# Patient Record
Sex: Female | Born: 1970 | Hispanic: No | Marital: Married | Smoking: Never smoker
Health system: Southern US, Community
[De-identification: ages and names within clinical notes are randomized; demographics above are authoritative.]

## PROBLEM LIST (undated history)

## (undated) HISTORY — PX: AUGMENTATION MAMMAPLASTY: SUR837

---

## 2014-01-08 ENCOUNTER — Other Ambulatory Visit (HOSPITAL_COMMUNITY)
Admission: RE | Admit: 2014-01-08 | Discharge: 2014-01-08 | Disposition: A | Discharge status: Home / Self Care | Payer: BC Managed Care – PPO | Source: Ambulatory Visit | Attending: Obstetrics & Gynecology | Admitting: Obstetrics & Gynecology

## 2014-01-08 DIAGNOSIS — Z1151 Encounter for screening for human papillomavirus (HPV): Secondary | ICD-10-CM | POA: Insufficient documentation

## 2014-01-08 DIAGNOSIS — Z01419 Encounter for gynecological examination (general) (routine) without abnormal findings: Secondary | ICD-10-CM | POA: Insufficient documentation

## 2015-09-04 MED FILL — VYVANSE 40 MG CAPSULE: 40 | 30 days supply | Qty: 30 | Fill #0

## 2015-10-02 DIAGNOSIS — E039 Hypothyroidism, unspecified: Secondary | ICD-10-CM | POA: Diagnosis not present

## 2015-10-02 DIAGNOSIS — Z79899 Other long term (current) drug therapy: Secondary | ICD-10-CM | POA: Diagnosis not present

## 2015-10-02 DIAGNOSIS — F902 Attention-deficit hyperactivity disorder, combined type: Secondary | ICD-10-CM | POA: Diagnosis not present

## 2015-10-09 MED FILL — VYVANSE 40 MG CAPSULE: 40 | 30 days supply | Qty: 30 | Fill #0

## 2015-11-06 DIAGNOSIS — N951 Menopausal and female climacteric states: Secondary | ICD-10-CM | POA: Diagnosis not present

## 2015-11-06 DIAGNOSIS — Z01411 Encounter for gynecological examination (general) (routine) with abnormal findings: Secondary | ICD-10-CM | POA: Diagnosis not present

## 2015-11-06 DIAGNOSIS — R140 Abdominal distension (gaseous): Secondary | ICD-10-CM | POA: Diagnosis not present

## 2015-11-06 DIAGNOSIS — R102 Pelvic and perineal pain: Secondary | ICD-10-CM | POA: Diagnosis not present

## 2015-11-06 MED FILL — ESTRADIOL PATCH 0.0375: 0.0375 | 28 days supply | Qty: 8 | Fill #0

## 2015-11-06 MED FILL — LEVOTHYROXINE 50 MCG TABLET: 50 | 90 days supply | Qty: 90 | Fill #0

## 2015-11-08 MED FILL — VYVANSE 40 MG CAPSULE: 40 | 30 days supply | Qty: 30 | Fill #0

## 2015-12-10 MED FILL — VYVANSE 40 MG CAPSULE: 40 | 30 days supply | Qty: 30 | Fill #0

## 2015-12-10 MED FILL — ESTRADIOL PATCH 0.0375: 0.0375 | 28 days supply | Qty: 8 | Fill #1

## 2015-12-18 ENCOUNTER — Other Ambulatory Visit: Payer: Self-pay | Admitting: Obstetrics & Gynecology

## 2015-12-18 DIAGNOSIS — Z1231 Encounter for screening mammogram for malignant neoplasm of breast: Secondary | ICD-10-CM

## 2016-01-01 DIAGNOSIS — R938 Abnormal findings on diagnostic imaging of other specified body structures: Secondary | ICD-10-CM | POA: Diagnosis not present

## 2016-01-01 DIAGNOSIS — R102 Pelvic and perineal pain: Secondary | ICD-10-CM | POA: Diagnosis not present

## 2016-01-01 DIAGNOSIS — N951 Menopausal and female climacteric states: Secondary | ICD-10-CM | POA: Diagnosis not present

## 2016-01-01 DIAGNOSIS — R140 Abdominal distension (gaseous): Secondary | ICD-10-CM | POA: Diagnosis not present

## 2016-01-01 MED FILL — ESTRADIOL PATCH 0.0375: 0.0375 | 84 days supply | Qty: 24 | Fill #0

## 2016-01-08 ENCOUNTER — Ambulatory Visit
Admission: RE | Admit: 2016-01-08 | Discharge: 2016-01-08 | Disposition: A | Discharge status: Home / Self Care | Payer: 59 | Source: Ambulatory Visit | Attending: Obstetrics & Gynecology | Admitting: Obstetrics & Gynecology

## 2016-01-08 DIAGNOSIS — Z1231 Encounter for screening mammogram for malignant neoplasm of breast: Secondary | ICD-10-CM | POA: Diagnosis not present

## 2016-01-09 MED FILL — VYVANSE 40 MG CAPSULE: 40 | 30 days supply | Qty: 30 | Fill #0

## 2016-02-13 MED FILL — VYVANSE 40 MG CAPSULE: 40 | 30 days supply | Qty: 30 | Fill #0

## 2016-02-21 MED FILL — LEVOTHYROXINE 50 MCG TABLET: 50 | 90 days supply | Qty: 90 | Fill #1

## 2016-03-20 MED FILL — VYVANSE 40 MG CAPSULE: 40 | 30 days supply | Qty: 30 | Fill #0

## 2016-03-27 MED FILL — ESTRADIOL PATCH 0.0375: 0.0375 | 84 days supply | Qty: 24 | Fill #1

## 2016-04-22 DIAGNOSIS — E039 Hypothyroidism, unspecified: Secondary | ICD-10-CM | POA: Diagnosis not present

## 2016-04-22 DIAGNOSIS — F902 Attention-deficit hyperactivity disorder, combined type: Secondary | ICD-10-CM | POA: Diagnosis not present

## 2016-04-22 DIAGNOSIS — Z79899 Other long term (current) drug therapy: Secondary | ICD-10-CM | POA: Diagnosis not present

## 2016-04-30 MED FILL — VYVANSE 40 MG CAPSULE: 40 | 30 days supply | Qty: 30 | Fill #0

## 2016-06-08 MED FILL — VYVANSE 40 MG CAPSULE: 40 | 30 days supply | Qty: 30 | Fill #0

## 2016-07-10 MED FILL — VYVANSE 40 MG CAPSULE: 40 | 30 days supply | Qty: 30 | Fill #0

## 2016-07-14 MED FILL — ESTRADIOL PATCH 0.0375: 0.0375 | 84 days supply | Qty: 24 | Fill #2

## 2016-07-22 DIAGNOSIS — R938 Abnormal findings on diagnostic imaging of other specified body structures: Secondary | ICD-10-CM | POA: Diagnosis not present

## 2016-07-22 DIAGNOSIS — N840 Polyp of corpus uteri: Secondary | ICD-10-CM | POA: Diagnosis not present

## 2016-07-22 DIAGNOSIS — N950 Postmenopausal bleeding: Secondary | ICD-10-CM | POA: Diagnosis not present

## 2016-07-22 DIAGNOSIS — E039 Hypothyroidism, unspecified: Secondary | ICD-10-CM | POA: Diagnosis not present

## 2016-07-29 DIAGNOSIS — E039 Hypothyroidism, unspecified: Secondary | ICD-10-CM | POA: Diagnosis not present

## 2016-07-29 DIAGNOSIS — L309 Dermatitis, unspecified: Secondary | ICD-10-CM | POA: Diagnosis not present

## 2016-07-29 DIAGNOSIS — E7800 Pure hypercholesterolemia, unspecified: Secondary | ICD-10-CM | POA: Diagnosis not present

## 2016-07-29 MED FILL — CLOBETASOL 0.05% CREAM: 0.05 | 15 days supply | Qty: 30 | Fill #0

## 2016-07-29 MED FILL — LEVOTHYROXINE 50 MCG TABLET: 50 | 30 days supply | Qty: 30 | Fill #0

## 2016-07-29 MED FILL — MUPIROCIN 2% OINTMENT: 2 | 10 days supply | Qty: 22 | Fill #0

## 2016-08-10 ENCOUNTER — Ambulatory Visit (INDEPENDENT_AMBULATORY_CARE_PROVIDER_SITE_OTHER): Payer: 59 | Admitting: Family Medicine

## 2016-08-10 ENCOUNTER — Encounter: Payer: Self-pay | Admitting: Family Medicine

## 2016-08-10 VITALS — BP 136/88 | HR 90 | Resp 16

## 2016-08-10 DIAGNOSIS — R0789 Other chest pain: Secondary | ICD-10-CM

## 2016-08-10 NOTE — Progress Notes (Signed)
   Subjective:    Patient ID: Robin Walter, female    DOB: November 10, 1970, 46 y.o.   MRN: [id number]  HPI  She is seen here today with chest wall pain that started 2 days ago. Pain is described as occurring with movement and taking deep breaths. This pain is noted to be sharp and worse this morning; mild improvement with 400 mg ibuprofen today.  Today, she denies SOB, chest pain, arm pain, N/V, sweating, tearing pain, cough, leg swelling, or reflux.  History of estrogen replacement with patch. No history of DVT, immobilizations, hemoptysis, malignancy, DOE, dysphagia, fever,  heartburn, or regurgitation. She reports feeling pain with movement of shoulder and can recreate pain with movement. No pain with palpation.  She is seeking care today as she is having a surgical procedure tomorrow and would like to have lungs auscultated.   Review of Systems  Constitutional: Negative for chills, fatigue and fever.  HENT: Negative for congestion.   Respiratory: Negative for cough, shortness of breath and wheezing.   Cardiovascular: Negative for chest pain and palpitations.  Gastrointestinal: Negative for abdominal pain, diarrhea, nausea and vomiting.  Genitourinary: Negative for dysuria.  Musculoskeletal:       Chest wall pain  Skin: Negative for rash.  Neurological: Negative for dizziness, light-headedness and headaches.   No past medical history on file.   Social History   Social History  . Marital status: Married    Spouse name: N/A  . Number of children: N/A  . Years of education: N/A   Occupational History  . Not on file.   Social History Main Topics  . Smoking status: Not on file  . Smokeless tobacco: Not on file  . Alcohol use Not on file  . Drug use: Unknown  . Sexual activity: Not on file   Other Topics Concern  . Not on file   Social History Narrative  . No narrative on file    No past surgical history on file.  No family history on file.  Allergies not on file  No  current outpatient prescriptions on file prior to visit.   No current facility-administered medications on file prior to visit.     BP 136/88   Pulse 90   Resp 16   LMP  (LMP Unknown)   SpO2 99%       Objective:   Physical Exam  Constitutional: She is oriented to person, place, and time. She appears well-developed and well-nourished.  Eyes: Pupils are equal, round, and reactive to light. No scleral icterus.  Neck: Neck supple.  Cardiovascular: Normal rate, regular rhythm and normal heart sounds.   Pulmonary/Chest: Effort normal and breath sounds normal. She has no wheezes. She has no rales.  Abdominal: There is no tenderness.  Lymphadenopathy:    She has no cervical adenopathy.  Neurological: She is alert and oriented to person, place, and time.  Skin: Skin is warm and dry. No rash noted.       Assessment & Plan:  1. Musculoskeletal chest pain Exam and history are reassuring; excellent movement of air; pulse oximeter 99%; no SOB; pain is reproducible with movement. EKG and further work up declined by patient as symptoms are explainable with movement and improvement with ibuprofen.  Patient is a physician and aware of return precautions. She voiced understanding to seek medical attention immediately with worsening pain, radiation, SOB, or new symptoms.  Delano Metz, FNP-C

## 2016-08-11 DIAGNOSIS — N840 Polyp of corpus uteri: Secondary | ICD-10-CM | POA: Diagnosis not present

## 2016-08-11 DIAGNOSIS — N950 Postmenopausal bleeding: Secondary | ICD-10-CM | POA: Diagnosis not present

## 2016-08-13 MED FILL — VYVANSE 40 MG CAPSULE: 40 | 30 days supply | Qty: 30 | Fill #0

## 2016-09-02 DIAGNOSIS — Z4889 Encounter for other specified surgical aftercare: Secondary | ICD-10-CM | POA: Diagnosis not present

## 2016-09-02 DIAGNOSIS — N951 Menopausal and female climacteric states: Secondary | ICD-10-CM | POA: Diagnosis not present

## 2016-09-09 MED FILL — LEVOTHYROXINE 50 MCG TABLET: 50 | 90 days supply | Qty: 90 | Fill #0

## 2016-09-14 MED FILL — VYVANSE 40 MG CAPSULE: 40 | 30 days supply | Qty: 30 | Fill #0

## 2016-09-25 DIAGNOSIS — Z79899 Other long term (current) drug therapy: Secondary | ICD-10-CM | POA: Diagnosis not present

## 2016-09-25 DIAGNOSIS — F902 Attention-deficit hyperactivity disorder, combined type: Secondary | ICD-10-CM | POA: Diagnosis not present

## 2016-09-25 DIAGNOSIS — E039 Hypothyroidism, unspecified: Secondary | ICD-10-CM | POA: Diagnosis not present

## 2016-10-14 MED FILL — ESTRADIOL PATCH 0.0375: 0.0375 | 84 days supply | Qty: 24 | Fill #3

## 2016-10-16 MED FILL — VYVANSE 40 MG CAPSULE: 40 | 30 days supply | Qty: 30 | Fill #0

## 2016-11-11 MED FILL — VYVANSE 40 MG CAPSULE: 40 | 30 days supply | Qty: 30 | Fill #0

## 2016-12-10 MED FILL — VYVANSE 40 MG CAPSULE: 40 | 30 days supply | Qty: 30 | Fill #0

## 2016-12-28 MED FILL — LEVOTHYROXINE 50 MCG TABLET: 50 | 90 days supply | Qty: 90 | Fill #1

## 2016-12-28 MED FILL — CLOBETASOL 0.05% CREAM: 0.05 | 15 days supply | Qty: 30 | Fill #1

## 2016-12-28 MED FILL — ESTRADIOL PATCH 0.0375: 0.0375 | 84 days supply | Qty: 24 | Fill #0

## 2016-12-31 DIAGNOSIS — Z79899 Other long term (current) drug therapy: Secondary | ICD-10-CM | POA: Diagnosis not present

## 2016-12-31 DIAGNOSIS — F902 Attention-deficit hyperactivity disorder, combined type: Secondary | ICD-10-CM | POA: Diagnosis not present

## 2016-12-31 DIAGNOSIS — F1920 Other psychoactive substance dependence, uncomplicated: Secondary | ICD-10-CM | POA: Diagnosis not present

## 2017-01-08 DIAGNOSIS — Z111 Encounter for screening for respiratory tuberculosis: Secondary | ICD-10-CM | POA: Diagnosis not present

## 2017-01-14 MED FILL — VYVANSE 40 MG CAPSULE: 40 | 30 days supply | Qty: 30 | Fill #0

## 2017-03-11 DIAGNOSIS — H524 Presbyopia: Secondary | ICD-10-CM | POA: Diagnosis not present

## 2017-03-11 DIAGNOSIS — H1013 Acute atopic conjunctivitis, bilateral: Secondary | ICD-10-CM | POA: Diagnosis not present

## 2017-03-11 DIAGNOSIS — H11441 Conjunctival cysts, right eye: Secondary | ICD-10-CM | POA: Diagnosis not present

## 2017-03-11 MED FILL — VYVANSE 40 MG CAPSULE: 40 | 30 days supply | Qty: 30 | Fill #0

## 2017-04-15 DIAGNOSIS — F902 Attention-deficit hyperactivity disorder, combined type: Secondary | ICD-10-CM | POA: Diagnosis not present

## 2017-04-15 DIAGNOSIS — Z79899 Other long term (current) drug therapy: Secondary | ICD-10-CM | POA: Diagnosis not present

## 2017-04-15 MED FILL — VYVANSE 40 MG CAPSULE: 40 | 30 days supply | Qty: 30 | Fill #0

## 2017-04-15 MED FILL — LEVOTHYROXINE 50 MCG TABLET: 50 | 90 days supply | Qty: 90 | Fill #2

## 2017-04-30 MED FILL — ESTRADIOL PATCH 0.0375: 0.0375 | 84 days supply | Qty: 24 | Fill #0

## 2017-05-06 MED FILL — CLOBETASOL PROPIONATE 0.05: 0.05 | 15 days supply | Qty: 30 | Fill #2

## 2017-07-09 MED FILL — LEVOTHYROXINE 50 MCG TABLET: 50 | 90 days supply | Qty: 90 | Fill #3

## 2017-08-12 DIAGNOSIS — F430 Acute stress reaction: Secondary | ICD-10-CM | POA: Diagnosis not present

## 2017-08-12 DIAGNOSIS — F329 Major depressive disorder, single episode, unspecified: Secondary | ICD-10-CM | POA: Diagnosis not present

## 2017-08-12 DIAGNOSIS — L309 Dermatitis, unspecified: Secondary | ICD-10-CM | POA: Diagnosis not present

## 2017-08-12 MED FILL — buPROPion HCL ER (XL) 150 M: 150 | 30 days supply | Qty: 30 | Fill #0

## 2017-09-08 MED FILL — buPROPion HCL ER (XL) 150 M: 150 | 30 days supply | Qty: 30 | Fill #1

## 2017-09-16 DIAGNOSIS — F329 Major depressive disorder, single episode, unspecified: Secondary | ICD-10-CM | POA: Diagnosis not present

## 2017-09-16 DIAGNOSIS — E785 Hyperlipidemia, unspecified: Secondary | ICD-10-CM | POA: Diagnosis not present

## 2017-09-16 MED FILL — ROSUVASTATIN CALCIUM 10 MG: 10 | 30 days supply | Qty: 30 | Fill #0

## 2017-09-16 MED FILL — BUPROPION HCL XL 300 MG TAB: 300 | 30 days supply | Qty: 30 | Fill #0

## 2017-10-13 DIAGNOSIS — E785 Hyperlipidemia, unspecified: Secondary | ICD-10-CM | POA: Diagnosis not present

## 2017-10-25 MED FILL — BUPROPION HCL XL 300 MG TAB: 300 | 30 days supply | Qty: 30 | Fill #1

## 2017-10-25 MED FILL — LEVOTHYROXINE 50 MCG TABLET: 50 | 90 days supply | Qty: 90 | Fill #0

## 2017-11-24 MED FILL — BUPROPION HCL XL 300 MG TAB: 300 | 30 days supply | Qty: 30 | Fill #2

## 2017-12-16 DIAGNOSIS — E785 Hyperlipidemia, unspecified: Secondary | ICD-10-CM | POA: Diagnosis not present

## 2017-12-16 DIAGNOSIS — L309 Dermatitis, unspecified: Secondary | ICD-10-CM | POA: Diagnosis not present

## 2017-12-16 DIAGNOSIS — F329 Major depressive disorder, single episode, unspecified: Secondary | ICD-10-CM | POA: Diagnosis not present

## 2017-12-16 MED FILL — CLOBETASOL PROPIONATE 0.05: 0.05 | 30 days supply | Qty: 15 | Fill #0

## 2017-12-16 MED FILL — ROSUVASTATIN CALCIUM 10 MG: 10 | 30 days supply | Qty: 30 | Fill #0

## 2017-12-22 MED FILL — buPROPion HCL ER (XL) 300 M: 300 | 30 days supply | Qty: 30 | Fill #0

## 2018-01-24 MED FILL — buPROPion HCL ER (XL) 300 M: 300 | 30 days supply | Qty: 30 | Fill #1

## 2018-03-02 MED FILL — buPROPion HCL ER (XL) 300 M: 300 | 30 days supply | Qty: 30 | Fill #2

## 2018-04-05 MED FILL — LEVOTHYROXINE 50 MCG TABLET: 50 | 90 days supply | Qty: 90 | Fill #1

## 2018-04-13 MED FILL — buPROPion HCL ER (XL) 300 M: 300 | 30 days supply | Qty: 30 | Fill #3

## 2018-06-03 MED FILL — buPROPion HCL ER (XL) 300 M: 300 | 30 days supply | Qty: 30 | Fill #4

## 2018-06-16 MED FILL — CLOBETASOL PROPIONATE 0.05: 0.05 | 30 days supply | Qty: 15 | Fill #1

## 2018-07-07 DIAGNOSIS — E559 Vitamin D deficiency, unspecified: Secondary | ICD-10-CM | POA: Diagnosis not present

## 2018-07-07 DIAGNOSIS — F329 Major depressive disorder, single episode, unspecified: Secondary | ICD-10-CM | POA: Diagnosis not present

## 2018-07-07 DIAGNOSIS — Z0000 Encounter for general adult medical examination without abnormal findings: Secondary | ICD-10-CM | POA: Diagnosis not present

## 2018-07-07 DIAGNOSIS — E785 Hyperlipidemia, unspecified: Secondary | ICD-10-CM | POA: Diagnosis not present

## 2018-07-07 DIAGNOSIS — L309 Dermatitis, unspecified: Secondary | ICD-10-CM | POA: Diagnosis not present

## 2018-07-07 MED FILL — ROSUVASTATIN CALCIUM 10 MG: 10 | 89 days supply | Qty: 38 | Fill #0

## 2018-07-07 MED FILL — buPROPion HCL ER (XL) 300 M: 300 | 90 days supply | Qty: 90 | Fill #0

## 2018-07-07 MED FILL — MUPIROCIN 2% OINTMENT: 2 | 14 days supply | Qty: 22 | Fill #0

## 2018-08-05 ENCOUNTER — Other Ambulatory Visit: Payer: Self-pay | Admitting: Family Medicine

## 2018-08-05 MED ORDER — OSELTAMIVIR PHOSPHATE 75 MG PO CAPS: 75.0000 mg | ORAL_CAPSULE | Freq: Two times a day (BID) | ORAL | 0 refills | Status: AC

## 2018-09-15 MED FILL — LEVOTHYROXINE 50 MCG TABLET: 50 | 90 days supply | Qty: 90 | Fill #0

## 2018-10-17 MED FILL — buPROPion HCL ER (XL) 300 M: 300 | 90 days supply | Qty: 90 | Fill #1

## 2019-01-05 DIAGNOSIS — E785 Hyperlipidemia, unspecified: Secondary | ICD-10-CM | POA: Diagnosis not present

## 2019-01-05 DIAGNOSIS — E039 Hypothyroidism, unspecified: Secondary | ICD-10-CM | POA: Diagnosis not present

## 2019-01-05 DIAGNOSIS — F329 Major depressive disorder, single episode, unspecified: Secondary | ICD-10-CM | POA: Diagnosis not present

## 2019-01-05 MED FILL — ROSUVASTATIN CALCIUM 10 MG: 10 | 90 days supply | Qty: 90 | Fill #0

## 2019-01-05 MED FILL — buPROPion HCL ER (XL) 300 M: 300 | 90 days supply | Qty: 90 | Fill #0

## 2019-01-05 MED FILL — LEVOTHYROXINE 50 MCG TABLET: 50 | 90 days supply | Qty: 90 | Fill #0

## 2019-03-03 ENCOUNTER — Other Ambulatory Visit: Payer: Self-pay | Admitting: Occupational Medicine

## 2019-03-04 LAB — CBC WITH DIFFERENTIAL/PLATELET
Absolute Monocytes: 589 cells/uL (ref 200–950)
Basophils Absolute: 58 cells/uL (ref 0–200)
Basophils Relative: 0.9 %
Eosinophils Absolute: 237 cells/uL (ref 15–500)
Eosinophils Relative: 3.7 %
HCT: 43.2 % (ref 35.0–45.0)
Hemoglobin: 14.4 g/dL (ref 11.7–15.5)
Lymphs Abs: 1043 cells/uL (ref 850–3900)
MCH: 29.8 pg (ref 27.0–33.0)
MCHC: 33.3 g/dL (ref 32.0–36.0)
MCV: 89.3 fL (ref 80.0–100.0)
MPV: 10.3 fL (ref 7.5–12.5)
Monocytes Relative: 9.2 %
Neutro Abs: 4474 cells/uL (ref 1500–7800)
Neutrophils Relative %: 69.9 %
Platelets: 351 10*3/uL (ref 140–400)
RBC: 4.84 10*6/uL (ref 3.80–5.10)
RDW: 13.3 % (ref 11.0–15.0)
Total Lymphocyte: 16.3 %
WBC: 6.4 10*3/uL (ref 3.8–10.8)

## 2019-03-04 LAB — COMPLETE METABOLIC PANEL WITH GFR
AG Ratio: 1.8 (calc) (ref 1.0–2.5)
ALT: 22 U/L (ref 6–29)
AST: 21 U/L (ref 10–35)
Albumin: 4.6 g/dL (ref 3.6–5.1)
Alkaline phosphatase (APISO): 107 U/L (ref 31–125)
BUN: 15 mg/dL (ref 7–25)
CO2: 22 mmol/L (ref 20–32)
Calcium: 9.5 mg/dL (ref 8.6–10.2)
Chloride: 104 mmol/L (ref 98–110)
Creat: 0.9 mg/dL (ref 0.50–1.10)
GFR, Est African American: 87 mL/min/{1.73_m2} (ref >=60)
GFR, Est Non African American: 75 mL/min/{1.73_m2} (ref >=60)
Globulin: 2.6 g/dL (calc) (ref 1.9–3.7)
Glucose, Bld: 101 mg/dL — ABNORMAL HIGH (ref 65–99)
Potassium: 4.4 mmol/L (ref 3.5–5.3)
Sodium: 139 mmol/L (ref 135–146)
Total Bilirubin: 0.6 mg/dL (ref 0.2–1.2)
Total Protein: 7.2 g/dL (ref 6.1–8.1)

## 2019-03-04 LAB — LIPID PANEL
Cholesterol: 170 mg/dL (ref <200)
HDL: 48 mg/dL — ABNORMAL LOW (ref >=50)
LDL Cholesterol (Calc): 101 mg/dL (calc) — ABNORMAL HIGH
Non-HDL Cholesterol (Calc): 122 mg/dL (calc) (ref <130)
Total CHOL/HDL Ratio: 3.5 (calc) (ref <5.0)
Triglycerides: 111 mg/dL (ref <150)

## 2019-03-04 LAB — VITAMIN D 25 HYDROXY (VIT D DEFICIENCY, FRACTURES): Vit D, 25-Hydroxy: 24 ng/mL — ABNORMAL LOW (ref 30–100)

## 2019-03-04 LAB — TSH: TSH: 2.61 mIU/L

## 2019-03-04 LAB — HIGH SENSITIVITY CRP: hs-CRP: 1.5 mg/L

## 2019-03-04 LAB — VITAMIN B12: Vitamin B-12: 512 pg/mL (ref 200–1100)

## 2019-05-01 DIAGNOSIS — H1045 Other chronic allergic conjunctivitis: Secondary | ICD-10-CM | POA: Diagnosis not present

## 2019-05-01 DIAGNOSIS — H5319 Other subjective visual disturbances: Secondary | ICD-10-CM | POA: Diagnosis not present

## 2019-05-01 DIAGNOSIS — H5711 Ocular pain, right eye: Secondary | ICD-10-CM | POA: Diagnosis not present

## 2019-05-01 DIAGNOSIS — H11441 Conjunctival cysts, right eye: Secondary | ICD-10-CM | POA: Diagnosis not present

## 2019-05-11 MED FILL — BUPROPION HCL ER (XL) 300 M: 300 | 90 days supply | Qty: 90 | Fill #1

## 2019-06-08 DIAGNOSIS — D225 Melanocytic nevi of trunk: Secondary | ICD-10-CM | POA: Diagnosis not present

## 2019-06-08 DIAGNOSIS — D224 Melanocytic nevi of scalp and neck: Secondary | ICD-10-CM | POA: Diagnosis not present

## 2019-07-27 DIAGNOSIS — Z1211 Encounter for screening for malignant neoplasm of colon: Secondary | ICD-10-CM | POA: Diagnosis not present

## 2019-07-27 DIAGNOSIS — E785 Hyperlipidemia, unspecified: Secondary | ICD-10-CM | POA: Diagnosis not present

## 2019-07-27 DIAGNOSIS — F329 Major depressive disorder, single episode, unspecified: Secondary | ICD-10-CM | POA: Diagnosis not present

## 2019-07-27 DIAGNOSIS — E559 Vitamin D deficiency, unspecified: Secondary | ICD-10-CM | POA: Diagnosis not present

## 2019-07-27 DIAGNOSIS — Z0000 Encounter for general adult medical examination without abnormal findings: Secondary | ICD-10-CM | POA: Diagnosis not present

## 2019-07-31 ENCOUNTER — Other Ambulatory Visit: Payer: Self-pay | Admitting: Obstetrics & Gynecology

## 2019-08-02 MED FILL — LEVOTHYROXINE 50 MCG TABLET: 50 | 90 days supply | Qty: 90 | Fill #0

## 2019-08-03 DIAGNOSIS — N644 Mastodynia: Secondary | ICD-10-CM | POA: Diagnosis not present

## 2019-08-07 ENCOUNTER — Other Ambulatory Visit: Payer: Self-pay | Admitting: Obstetrics & Gynecology

## 2019-08-07 DIAGNOSIS — N644 Mastodynia: Secondary | ICD-10-CM

## 2019-08-31 ENCOUNTER — Other Ambulatory Visit: Payer: Self-pay | Admitting: Obstetrics & Gynecology

## 2019-08-31 ENCOUNTER — Other Ambulatory Visit: Payer: Self-pay

## 2019-08-31 ENCOUNTER — Ambulatory Visit
Admission: RE | Admit: 2019-08-31 | Discharge: 2019-08-31 | Disposition: A | Discharge status: Home / Self Care | Payer: 59 | Source: Ambulatory Visit | Attending: Obstetrics & Gynecology | Admitting: Obstetrics & Gynecology

## 2019-08-31 DIAGNOSIS — N644 Mastodynia: Secondary | ICD-10-CM

## 2019-08-31 DIAGNOSIS — N6311 Unspecified lump in the right breast, upper outer quadrant: Secondary | ICD-10-CM | POA: Diagnosis not present

## 2019-08-31 DIAGNOSIS — R928 Other abnormal and inconclusive findings on diagnostic imaging of breast: Secondary | ICD-10-CM | POA: Diagnosis not present

## 2019-08-31 DIAGNOSIS — N6012 Diffuse cystic mastopathy of left breast: Secondary | ICD-10-CM | POA: Diagnosis not present

## 2019-09-07 DIAGNOSIS — Z01419 Encounter for gynecological examination (general) (routine) without abnormal findings: Secondary | ICD-10-CM | POA: Diagnosis not present

## 2019-09-20 MED FILL — SCOPOLAMINE 1 MG/3DAYS PT72: 1 | 9 days supply | Qty: 3 | Fill #0

## 2019-10-10 MED FILL — LEVOTHYROXINE SODIUM 50 MCG: 50 | 90 days supply | Qty: 90 | Fill #0

## 2019-11-28 ENCOUNTER — Other Ambulatory Visit (HOSPITAL_BASED_OUTPATIENT_CLINIC_OR_DEPARTMENT_OTHER): Payer: Self-pay | Admitting: Family Medicine

## 2019-11-28 MED FILL — buPROPion HCL ER (XL) 300 M: 300 | 90 days supply | Qty: 90 | Fill #0

## 2020-01-11 ENCOUNTER — Other Ambulatory Visit (HOSPITAL_BASED_OUTPATIENT_CLINIC_OR_DEPARTMENT_OTHER): Payer: Self-pay | Admitting: Family Medicine

## 2020-01-11 DIAGNOSIS — N390 Urinary tract infection, site not specified: Secondary | ICD-10-CM | POA: Diagnosis not present

## 2020-01-11 DIAGNOSIS — E039 Hypothyroidism, unspecified: Secondary | ICD-10-CM | POA: Diagnosis not present

## 2020-01-11 DIAGNOSIS — E785 Hyperlipidemia, unspecified: Secondary | ICD-10-CM | POA: Diagnosis not present

## 2020-01-11 DIAGNOSIS — R300 Dysuria: Secondary | ICD-10-CM | POA: Diagnosis not present

## 2020-01-11 MED FILL — SYNTHROID 50 MCG TABLET: 50 | 90 days supply | Qty: 90 | Fill #0

## 2020-01-11 MED FILL — SULFAMETHOXAZOLE-TMP DS TAB: 800-160 | 7 days supply | Qty: 14 | Fill #0

## 2020-03-07 DIAGNOSIS — E039 Hypothyroidism, unspecified: Secondary | ICD-10-CM | POA: Diagnosis not present

## 2020-03-07 DIAGNOSIS — E785 Hyperlipidemia, unspecified: Secondary | ICD-10-CM | POA: Diagnosis not present

## 2020-03-07 DIAGNOSIS — Z01818 Encounter for other preprocedural examination: Secondary | ICD-10-CM | POA: Diagnosis not present

## 2020-03-07 DIAGNOSIS — R7309 Other abnormal glucose: Secondary | ICD-10-CM | POA: Diagnosis not present

## 2020-03-07 MED FILL — buPROPion HCL ER (XL) 300 M: 300 | 90 days supply | Qty: 90 | Fill #1

## 2020-04-16 MED FILL — SYNTHROID 50 MCG TABLET: 50 | 90 days supply | Qty: 90 | Fill #1

## 2020-04-25 DIAGNOSIS — R1012 Left upper quadrant pain: Secondary | ICD-10-CM | POA: Diagnosis not present

## 2020-04-25 DIAGNOSIS — R1902 Left upper quadrant abdominal swelling, mass and lump: Secondary | ICD-10-CM | POA: Diagnosis not present

## 2020-07-08 ENCOUNTER — Other Ambulatory Visit (HOSPITAL_BASED_OUTPATIENT_CLINIC_OR_DEPARTMENT_OTHER): Payer: Self-pay | Admitting: Family Medicine

## 2020-07-08 MED FILL — SYNTHROID 50 MCG TABLET: 50 | 90 days supply | Qty: 90 | Fill #0

## 2020-07-20 ENCOUNTER — Other Ambulatory Visit (HOSPITAL_BASED_OUTPATIENT_CLINIC_OR_DEPARTMENT_OTHER): Payer: Self-pay | Admitting: Plastic Surgery

## 2020-07-22 MED FILL — CEPHALEXIN 500 MG CAPSULE: 500 | 3 days supply | Qty: 12 | Fill #0

## 2020-07-22 MED FILL — ONDANSETRON ODT 4 MG TABLET: 4 | 2 days supply | Qty: 10 | Fill #0

## 2020-07-22 MED FILL — oxyCODONE HCL 5 MG TABS: 5 | 4 days supply | Qty: 25 | Fill #0

## 2020-07-22 MED FILL — diazePAM 2 MG TABS: 2 | 5 days supply | Qty: 15 | Fill #0

## 2020-07-24 DIAGNOSIS — E785 Hyperlipidemia, unspecified: Secondary | ICD-10-CM | POA: Diagnosis not present

## 2020-07-24 DIAGNOSIS — E039 Hypothyroidism, unspecified: Secondary | ICD-10-CM | POA: Diagnosis not present

## 2020-07-24 DIAGNOSIS — Z01818 Encounter for other preprocedural examination: Secondary | ICD-10-CM | POA: Diagnosis not present

## 2020-07-30 DIAGNOSIS — D171 Benign lipomatous neoplasm of skin and subcutaneous tissue of trunk: Secondary | ICD-10-CM | POA: Diagnosis not present

## 2020-07-30 DIAGNOSIS — T8544XA Capsular contracture of breast implant, initial encounter: Secondary | ICD-10-CM | POA: Diagnosis not present

## 2020-07-30 DIAGNOSIS — T8549XD Other mechanical complication of breast prosthesis and implant, subsequent encounter: Secondary | ICD-10-CM | POA: Diagnosis not present

## 2020-09-19 ENCOUNTER — Other Ambulatory Visit (HOSPITAL_BASED_OUTPATIENT_CLINIC_OR_DEPARTMENT_OTHER): Payer: Self-pay

## 2020-09-19 DIAGNOSIS — E039 Hypothyroidism, unspecified: Secondary | ICD-10-CM | POA: Diagnosis not present

## 2020-09-19 DIAGNOSIS — E785 Hyperlipidemia, unspecified: Secondary | ICD-10-CM | POA: Diagnosis not present

## 2020-09-19 DIAGNOSIS — Z0000 Encounter for general adult medical examination without abnormal findings: Secondary | ICD-10-CM | POA: Diagnosis not present

## 2020-09-19 DIAGNOSIS — Z1211 Encounter for screening for malignant neoplasm of colon: Secondary | ICD-10-CM | POA: Diagnosis not present

## 2020-09-19 DIAGNOSIS — Z7184 Encounter for health counseling related to travel: Secondary | ICD-10-CM | POA: Diagnosis not present

## 2020-09-19 MED ORDER — SCOPOLAMINE 1 MG/3DAYS TD PT72
MEDICATED_PATCH | TRANSDERMAL | 0 refills | Status: AC
  Filled 2020-09-19: qty 3, 9d supply, fill #0

## 2020-09-19 MED ORDER — LEVOTHYROXINE SODIUM 50 MCG PO TABS
ORAL_TABLET | ORAL | 3 refills | Status: AC
  Filled 2020-09-19: qty 90, 90d supply, fill #0
  Filled 2021-03-20: qty 90, 90d supply, fill #1

## 2020-12-02 DIAGNOSIS — Z1211 Encounter for screening for malignant neoplasm of colon: Secondary | ICD-10-CM | POA: Diagnosis not present

## 2020-12-06 ENCOUNTER — Other Ambulatory Visit (HOSPITAL_BASED_OUTPATIENT_CLINIC_OR_DEPARTMENT_OTHER): Payer: Self-pay

## 2020-12-06 MED ORDER — CLOBETASOL PROPIONATE 0.05 % EX CREA
TOPICAL_CREAM | CUTANEOUS | 2 refills | Status: AC
  Filled 2020-12-06: qty 15, 30d supply, fill #0

## 2020-12-13 ENCOUNTER — Other Ambulatory Visit (HOSPITAL_BASED_OUTPATIENT_CLINIC_OR_DEPARTMENT_OTHER): Payer: Self-pay

## 2021-02-06 DIAGNOSIS — Z20822 Contact with and (suspected) exposure to covid-19: Secondary | ICD-10-CM | POA: Diagnosis not present

## 2021-02-10 DIAGNOSIS — R0981 Nasal congestion: Secondary | ICD-10-CM | POA: Diagnosis not present

## 2021-02-10 DIAGNOSIS — R051 Acute cough: Secondary | ICD-10-CM | POA: Diagnosis not present

## 2021-02-10 DIAGNOSIS — U071 COVID-19: Secondary | ICD-10-CM | POA: Diagnosis not present

## 2021-02-10 DIAGNOSIS — R062 Wheezing: Secondary | ICD-10-CM | POA: Diagnosis not present

## 2021-03-20 ENCOUNTER — Other Ambulatory Visit (HOSPITAL_BASED_OUTPATIENT_CLINIC_OR_DEPARTMENT_OTHER): Payer: Self-pay

## 2021-05-07 ENCOUNTER — Other Ambulatory Visit (HOSPITAL_BASED_OUTPATIENT_CLINIC_OR_DEPARTMENT_OTHER): Payer: Self-pay

## 2021-05-07 MED ORDER — LEVOTHYROXINE SODIUM 50 MCG PO TABS
50.0000 ug | ORAL_TABLET | Freq: Every morning | ORAL | 3 refills | Status: AC
  Filled 2021-05-07: qty 90, 90d supply, fill #0

## 2021-05-08 ENCOUNTER — Other Ambulatory Visit (HOSPITAL_BASED_OUTPATIENT_CLINIC_OR_DEPARTMENT_OTHER): Payer: Self-pay

## 2021-05-08 MED ORDER — LEVOTHYROXINE SODIUM 50 MCG PO TABS
ORAL_TABLET | ORAL | 1 refills | Status: AC
  Filled 2021-05-08: qty 90, 90d supply, fill #0

## 2021-05-08 MED ORDER — CLOBETASOL PROPIONATE 0.05 % EX CREA
TOPICAL_CREAM | CUTANEOUS | 2 refills | Status: AC
  Filled 2021-05-08: qty 15, 15d supply, fill #0

## 2021-06-16 DIAGNOSIS — H524 Presbyopia: Secondary | ICD-10-CM | POA: Diagnosis not present

## 2021-06-16 DIAGNOSIS — H5711 Ocular pain, right eye: Secondary | ICD-10-CM | POA: Diagnosis not present

## 2021-06-16 DIAGNOSIS — H5319 Other subjective visual disturbances: Secondary | ICD-10-CM | POA: Diagnosis not present

## 2021-06-16 DIAGNOSIS — H1045 Other chronic allergic conjunctivitis: Secondary | ICD-10-CM | POA: Diagnosis not present

## 2021-10-09 DIAGNOSIS — E039 Hypothyroidism, unspecified: Secondary | ICD-10-CM | POA: Diagnosis not present

## 2021-10-09 DIAGNOSIS — E785 Hyperlipidemia, unspecified: Secondary | ICD-10-CM | POA: Diagnosis not present

## 2021-10-09 DIAGNOSIS — Z1211 Encounter for screening for malignant neoplasm of colon: Secondary | ICD-10-CM | POA: Diagnosis not present

## 2021-10-09 DIAGNOSIS — Z0000 Encounter for general adult medical examination without abnormal findings: Secondary | ICD-10-CM | POA: Diagnosis not present

## 2021-10-16 ENCOUNTER — Other Ambulatory Visit (HOSPITAL_BASED_OUTPATIENT_CLINIC_OR_DEPARTMENT_OTHER): Payer: Self-pay

## 2021-10-16 MED ORDER — ROSUVASTATIN CALCIUM 10 MG PO TABS
ORAL_TABLET | ORAL | 3 refills | Status: AC
  Filled 2021-10-16: qty 90, 90d supply, fill #0

## 2022-01-21 IMAGING — MG MM  DIGITAL DIAGNOSTIC BREAST BILAT IMPLANT W/ TOMO W/ CAD
8 of 14 series · 8 of 34 positions shown · non-contrast
Comparison: Screening mammogram dated 01/08/2016

CLINICAL DATA: Mass felt by the patient in the upper outer right
breast for the past 3-4 weeks. She had a I2QTQ-PX vaccine in the
left arm in May 2019. She had her implants placed 20 years ago
in the country of [place].

EXAM:
DIGITAL DIAGNOSTIC BILATERAL MAMMOGRAM WITH IMPLANTS, CAD AND TOMO
ULTRASOUND BILATERAL BREAST
The patient has retropectoral implants. Standard and implant
displaced views were performed.

[L MLO]
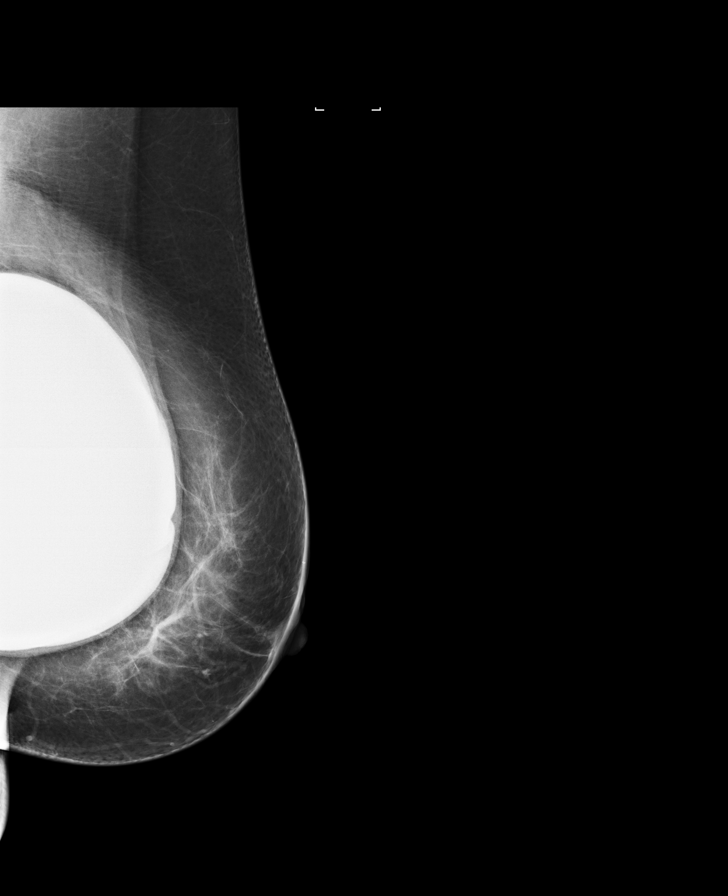

[R CC]
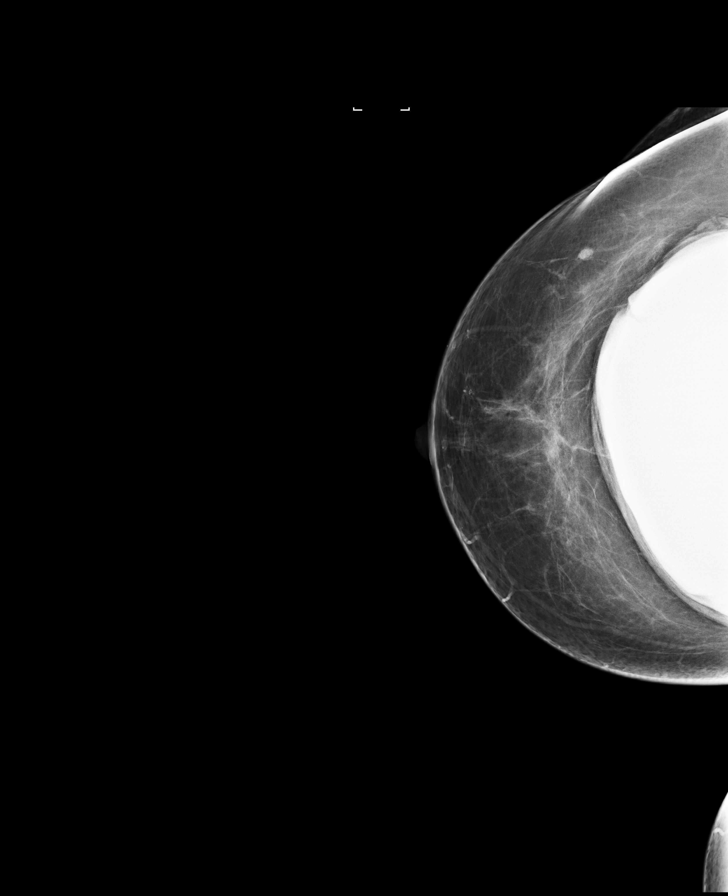

[R MLO]
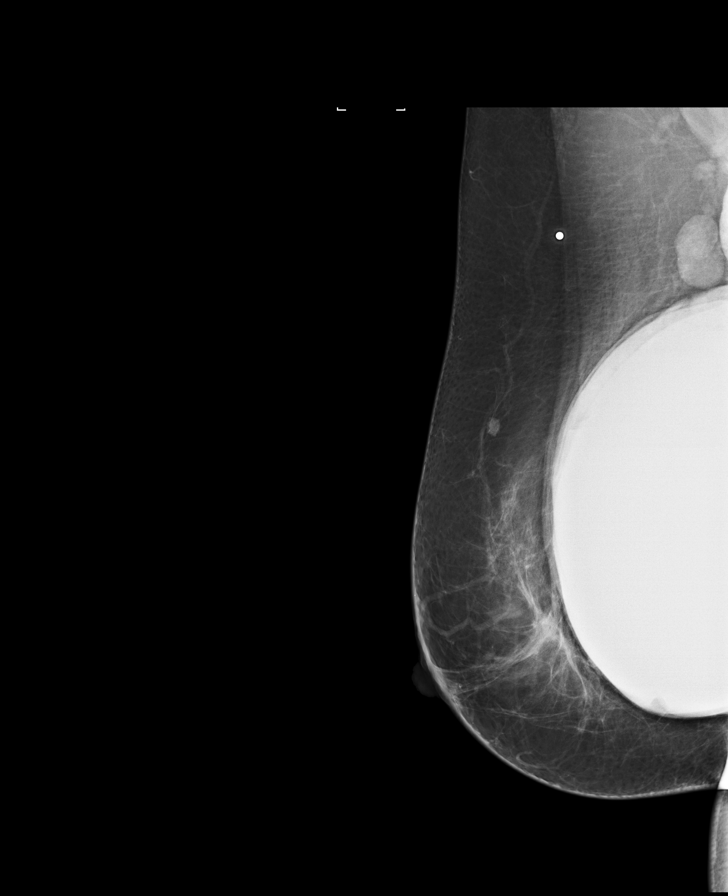

[L CC]
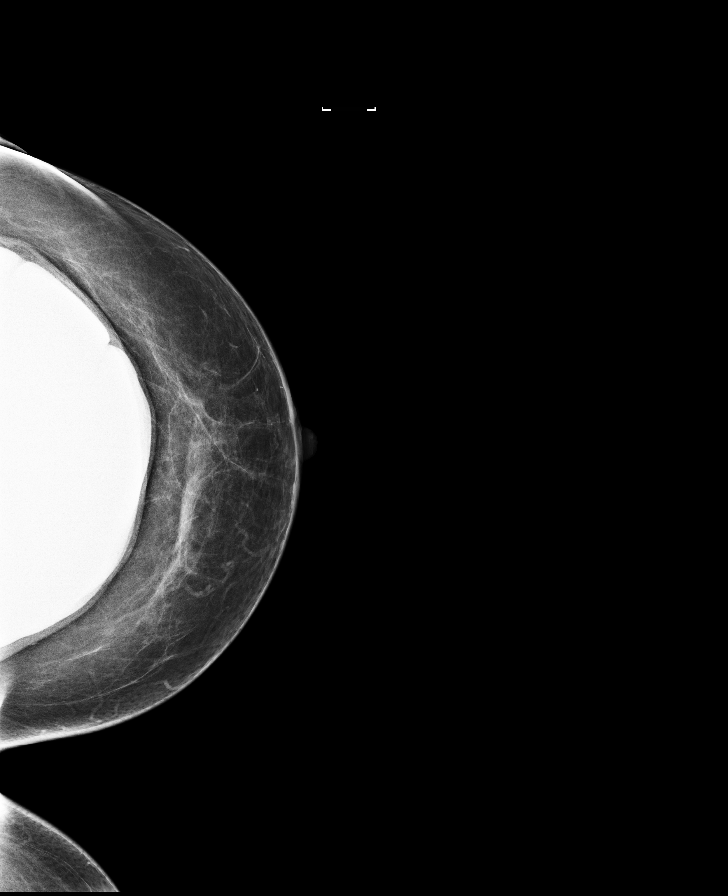

[R MLO synth-2D (1 of 2)]
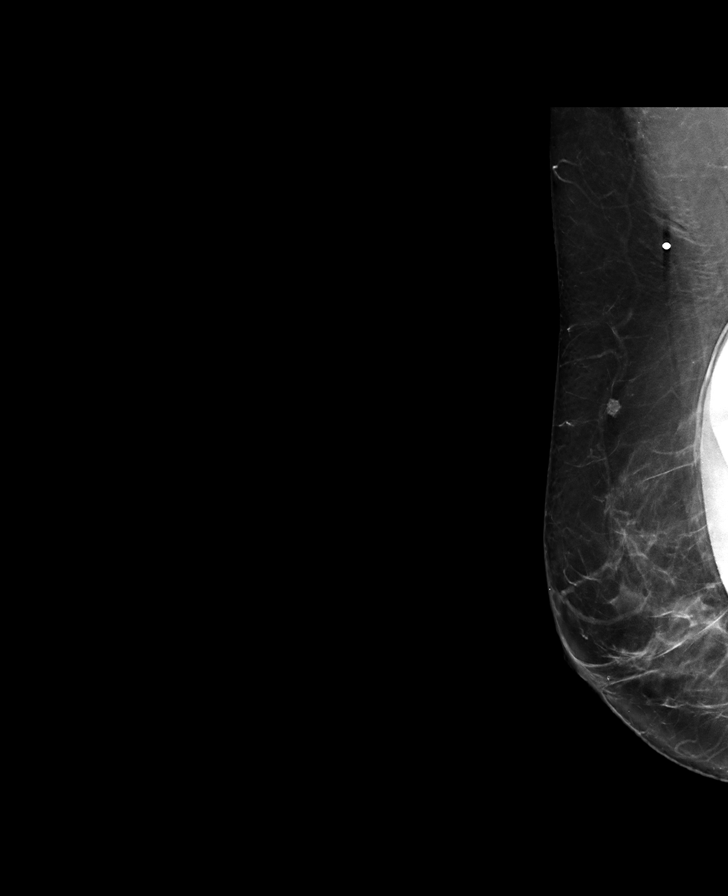

[R MLO synth-2D (2 of 2)]
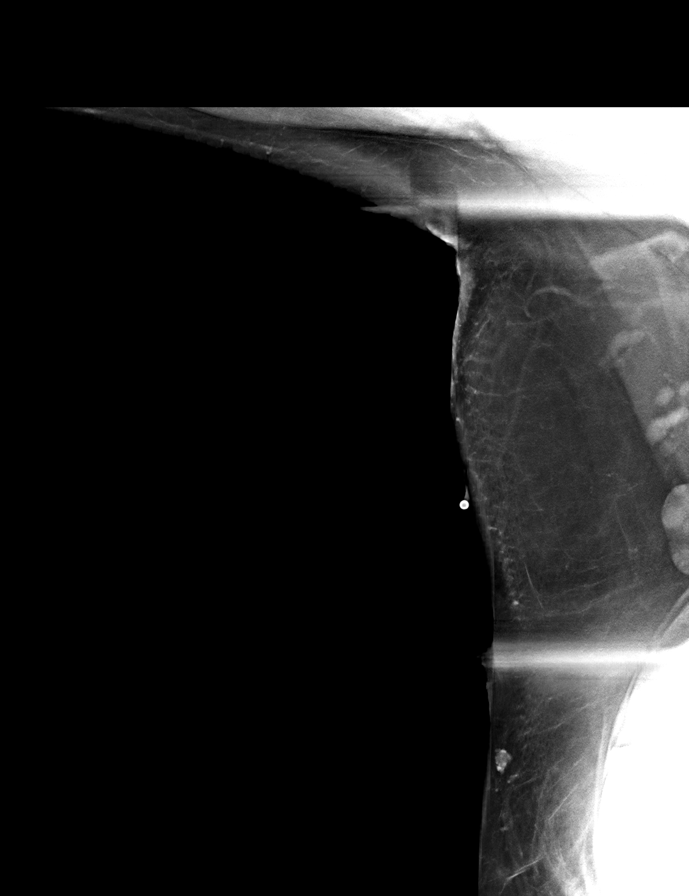

[L CC synth-2D]
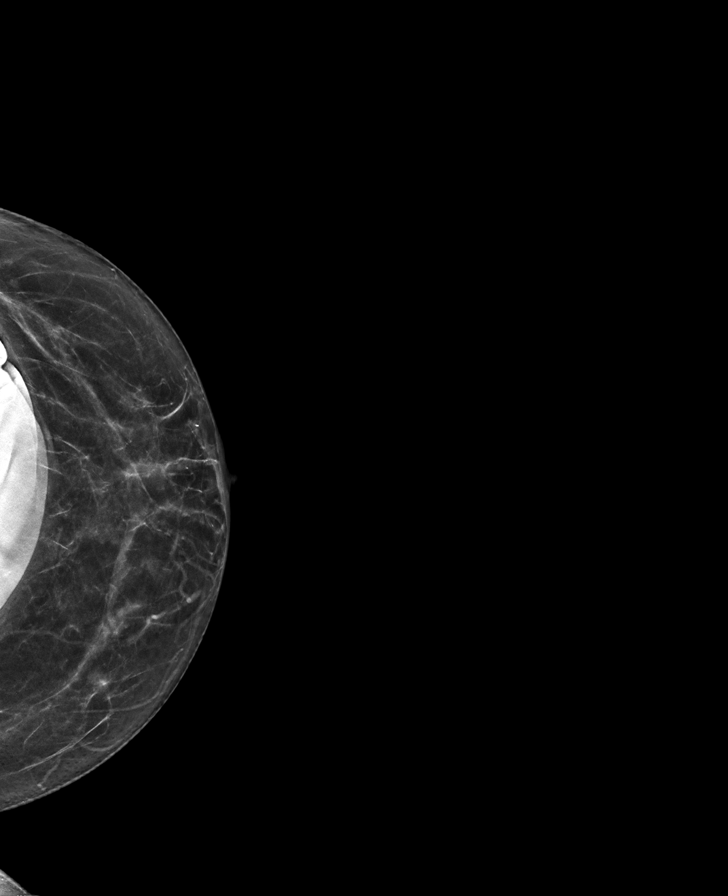

[L MLO synth-2D]
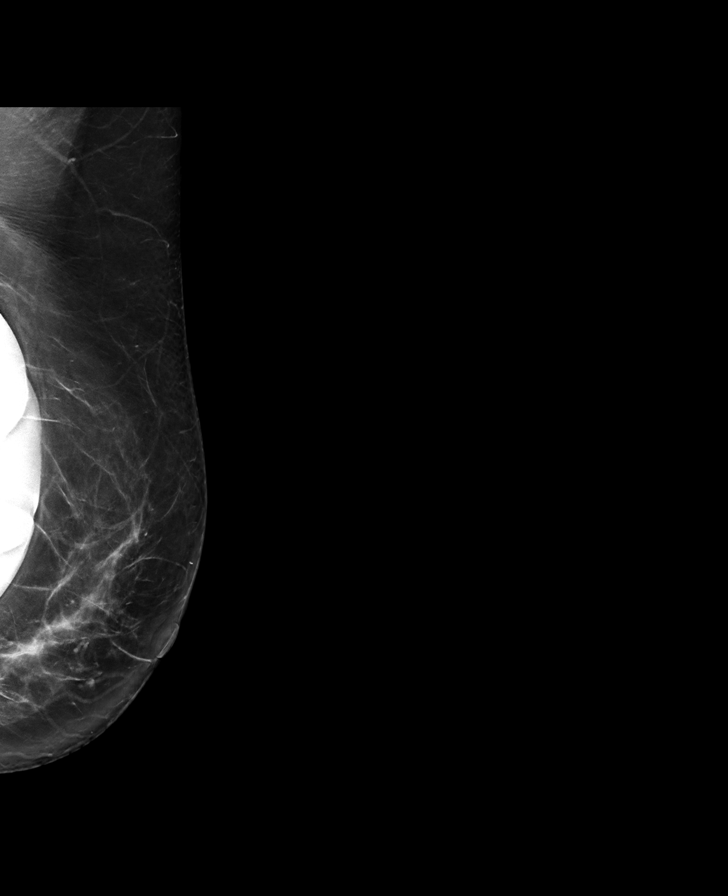

[8 of 34 positions shown; findings below may reference images not displayed]

ACR Breast Density Category b: There are scattered areas of
fibroglandular density.
FINDINGS: There is an oval, circumscribed mass deep in the inferior right
axilla or upper outer right breast corresponding to the mass felt by
the patient, marked with a metallic marker.

Also demonstrated is a small, mildly irregular and partially
calcified mass in the upper outer right breast.

Also demonstrated is a small asymmetry in the medial left breast.

On physical exam, there is a faintly palpable mass deep in inferior
right axilla.

Targeted ultrasound is performed, showing a 4.4 x 2.2 cm oval,
diffusely echogenic mass with slightly ill-defined margins and
diffuse ill-defined increased echogenicity acoustical enhancement
identical to the echotexture of mass, obscuring the posterior
borders of the mass. This corresponds to the mass felt by the
patient.

There are 2 adjacent smaller, rounded and oval identical appearing
echogenic masses. The larger of these measures 2.2 cm in maximum
diameter.

There is a 7 x 5 mm similar-appearing mass in the 11 o'clock
position of the right breast, 8 cm from the nipple, corresponding to
the small oval mass seen in the upper outer right breast on the
mammogram.

There is increased echogenicity within the right breast implant with
irregular margins and multiple small linear internal echoes.

Ultrasound of the left breast demonstrates a 6 x 5 x 3 mm cluster of
cysts in the 9 o'clock position, 5 cm from the nipple, corresponding
to the mammographic asymmetry.

The left breast implant has a normal appearance.

Mammographic images were processed with CAD.
IMPRESSION: 1. Right breast implant rupture with silicone in 3 right axillary
lymph nodes. The largest of the 3 lymph nodes corresponds to the
palpable mass.
2. 7 mm intramammary lymph node in the 11 o'clock position of the
right breast, containing silicone.
3. 6 mm benign cluster of cysts in the 9 o'clock position of the
left breast.
4. No evidence of malignancy in either breast.

RECOMMENDATION:
Bilateral screening mammogram in 1 year.

I have discussed the findings and recommendations with the patient.
If applicable, a reminder letter will be sent to the patient
regarding the next appointment.

BI-RADS CATEGORY  2: Benign.

## 2022-01-21 IMAGING — US US BREAST*R* LIMITED INC AXILLA
1 series · 13 of 20 positions shown · non-contrast
Comparison: Screening mammogram dated 01/08/2016

CLINICAL DATA: Mass felt by the patient in the upper outer right
breast for the past 3-4 weeks. She had a I2QTQ-PX vaccine in the
left arm in May 2019. She had her implants placed 20 years ago
in the country of [place].

EXAM:
DIGITAL DIAGNOSTIC BILATERAL MAMMOGRAM WITH IMPLANTS, CAD AND TOMO
ULTRASOUND BILATERAL BREAST
The patient has retropectoral implants. Standard and implant
displaced views were performed.

[Series 1: us breast*right* limited inc axilla · 0.09mm/px · 13 of 20 slices shown]
[im 1/20]
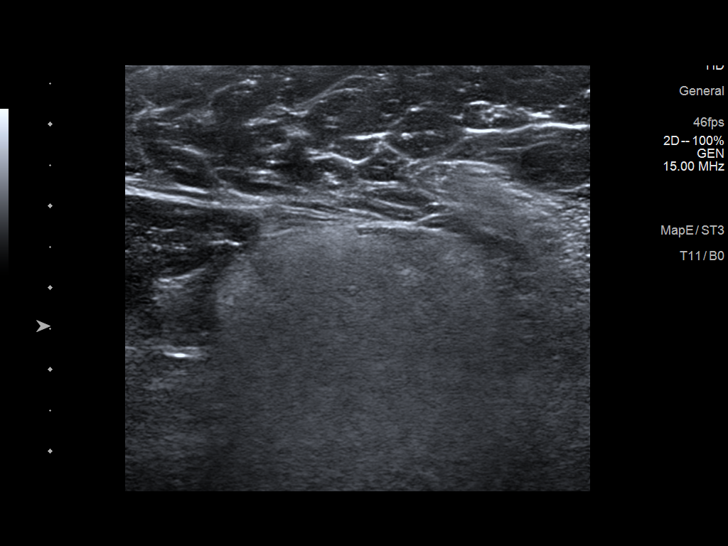
[im 3/20]
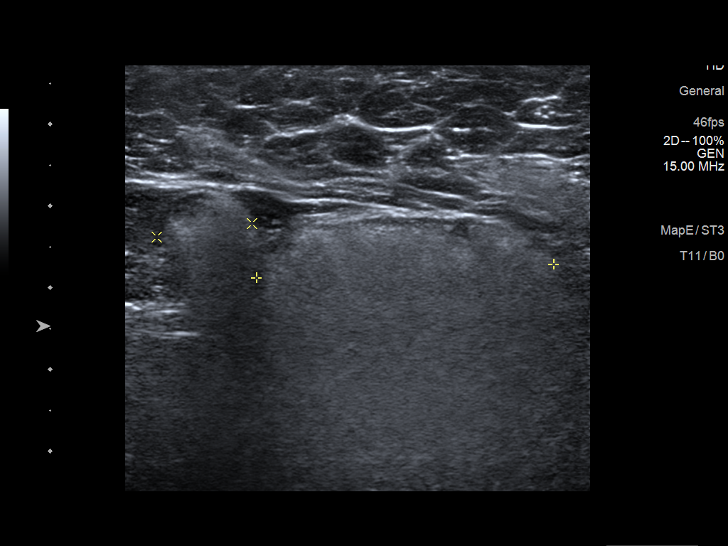
[im 4/20]
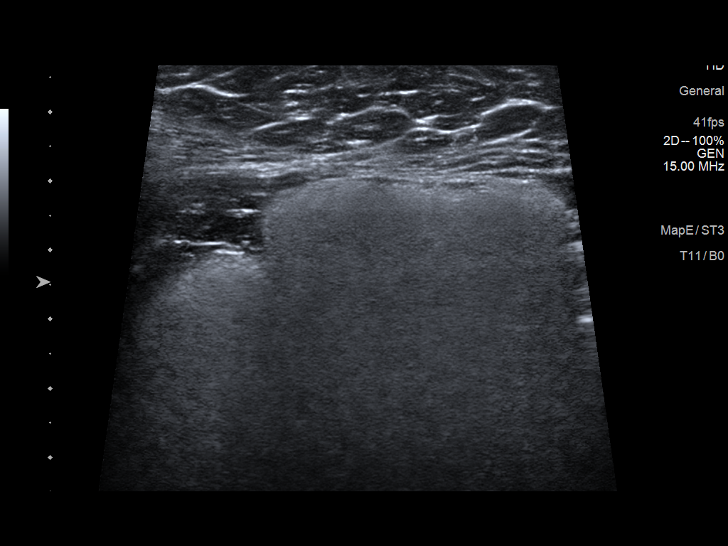
[im 6/20]
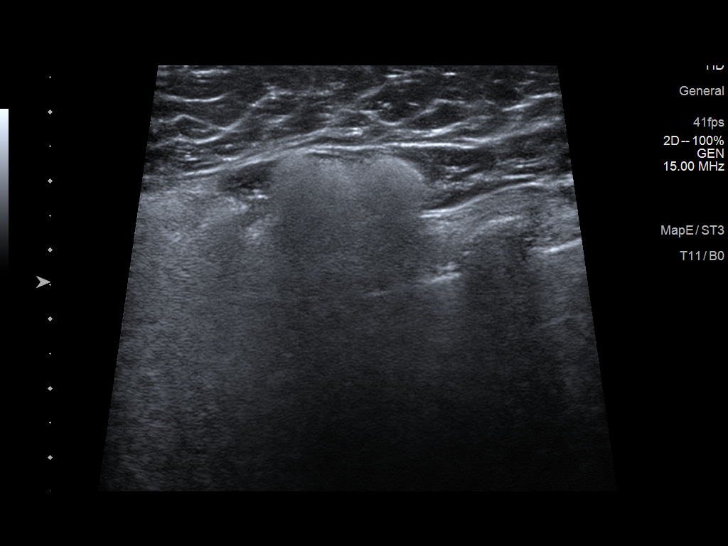
[im 7/20]
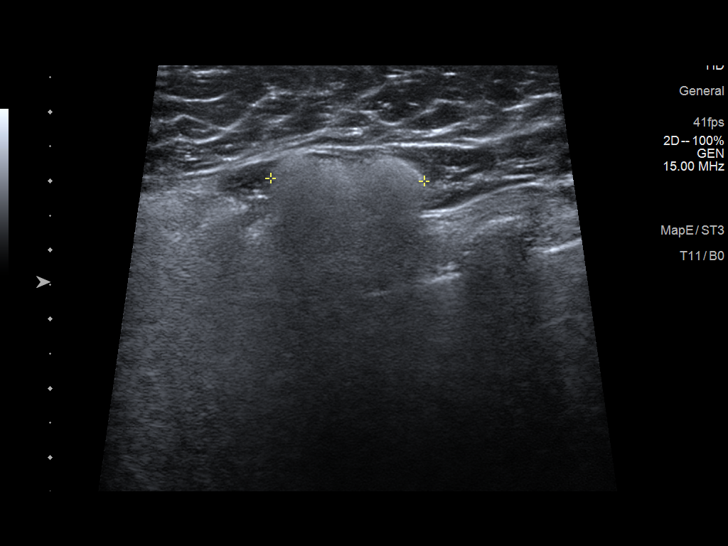
[im 9/20]
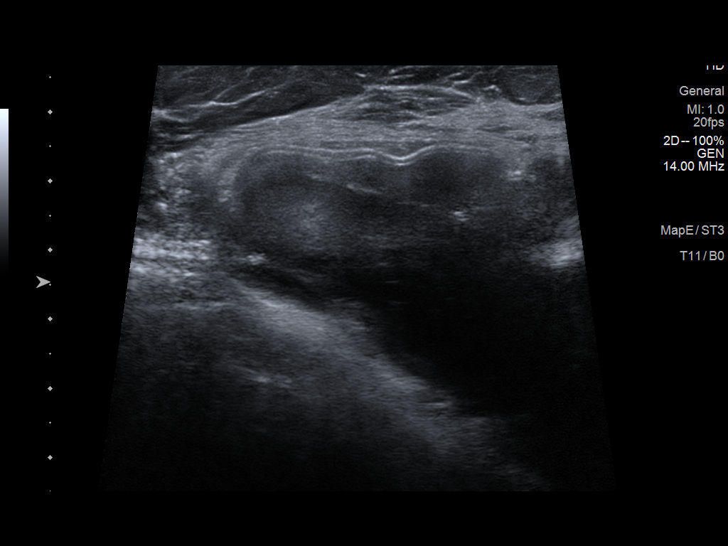
[im 11/20]
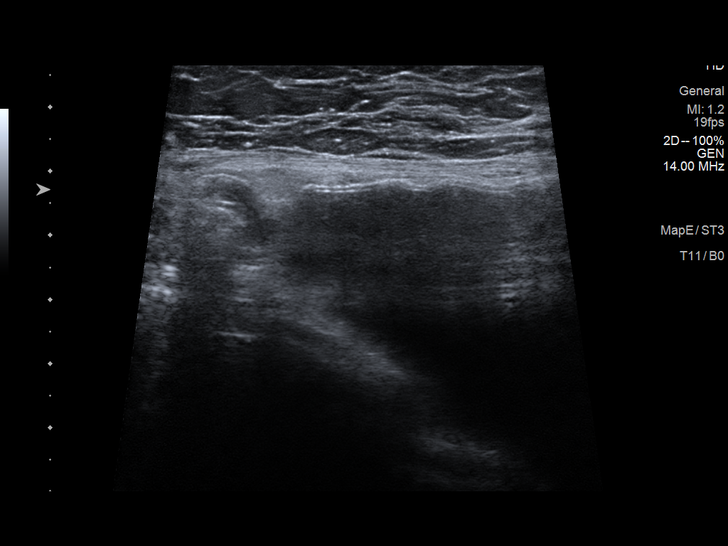
[im 12/20]
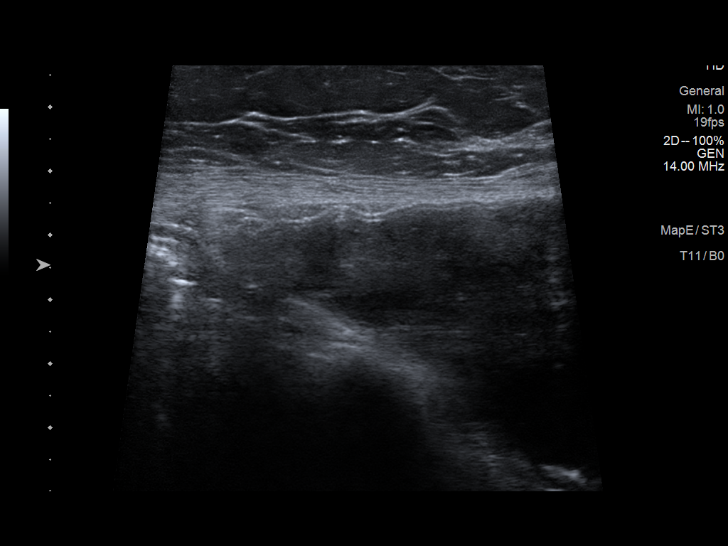
[im 14/20]
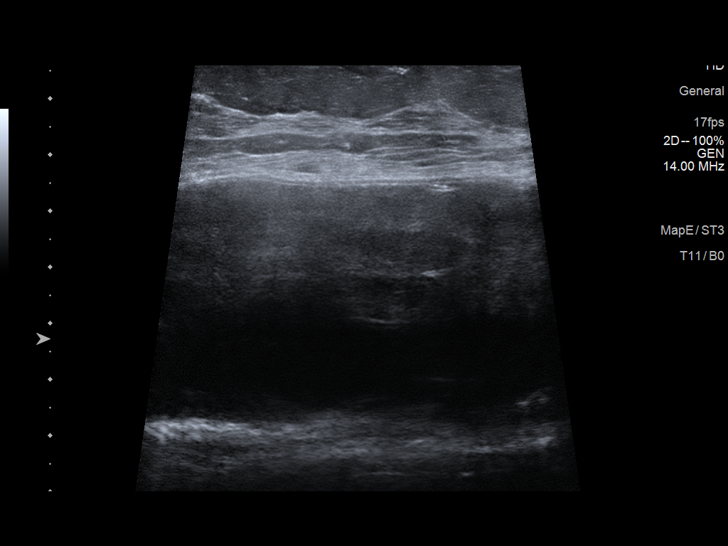
[im 15/20]
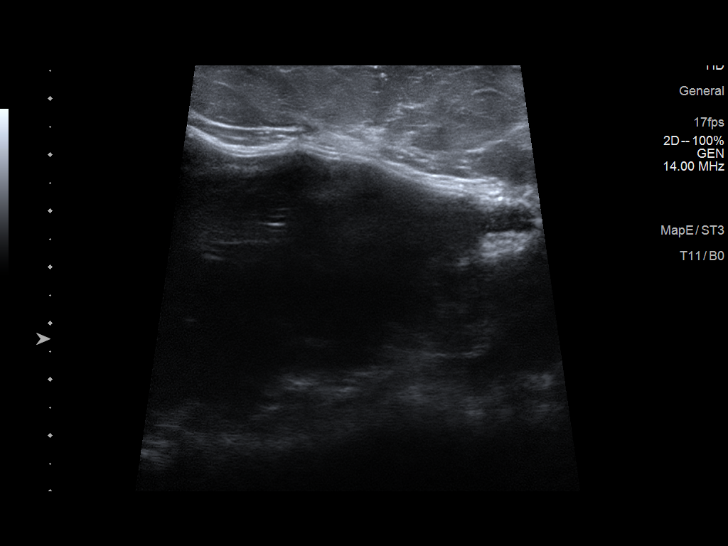
[im 17/20]
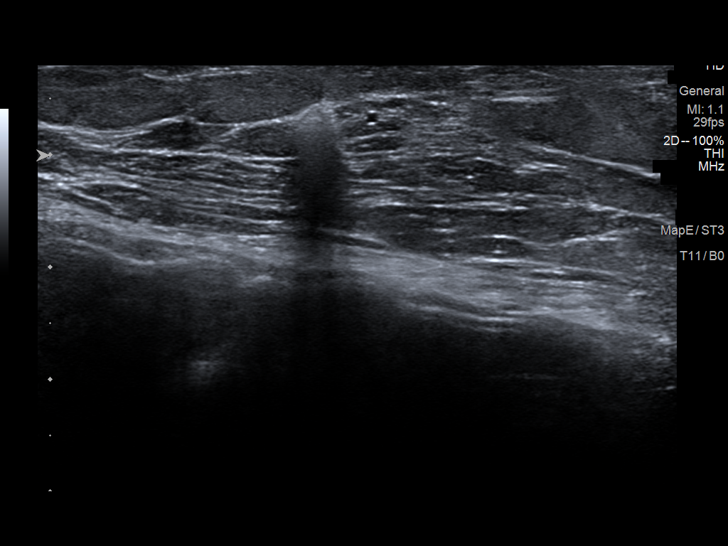
[im 18/20]
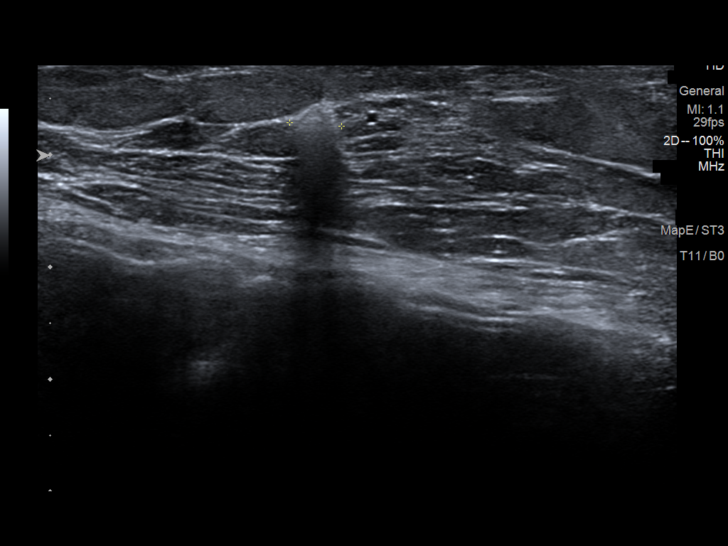
[im 20/20]
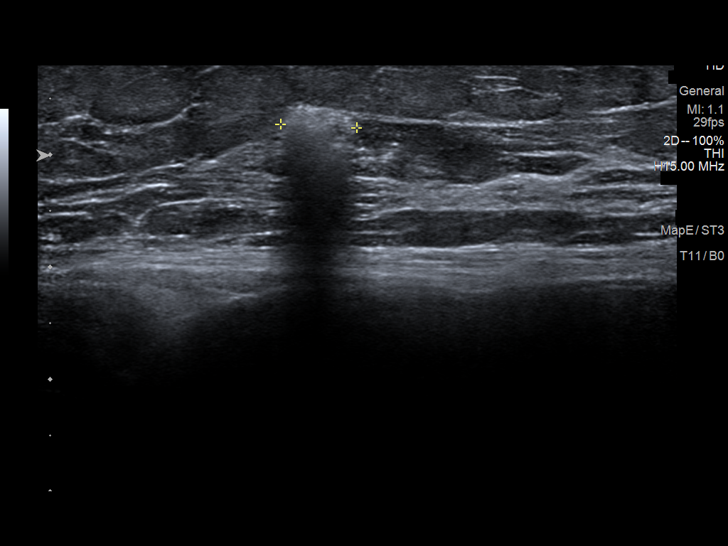

[13 of 20 positions shown; findings below may reference images not displayed]

ACR Breast Density Category b: There are scattered areas of
fibroglandular density.
FINDINGS: There is an oval, circumscribed mass deep in the inferior right
axilla or upper outer right breast corresponding to the mass felt by
the patient, marked with a metallic marker.

Also demonstrated is a small, mildly irregular and partially
calcified mass in the upper outer right breast.

Also demonstrated is a small asymmetry in the medial left breast.

On physical exam, there is a faintly palpable mass deep in inferior
right axilla.

Targeted ultrasound is performed, showing a 4.4 x 2.2 cm oval,
diffusely echogenic mass with slightly ill-defined margins and
diffuse ill-defined increased echogenicity acoustical enhancement
identical to the echotexture of mass, obscuring the posterior
borders of the mass. This corresponds to the mass felt by the
patient.

There are 2 adjacent smaller, rounded and oval identical appearing
echogenic masses. The larger of these measures 2.2 cm in maximum
diameter.

There is a 7 x 5 mm similar-appearing mass in the 11 o'clock
position of the right breast, 8 cm from the nipple, corresponding to
the small oval mass seen in the upper outer right breast on the
mammogram.

There is increased echogenicity within the right breast implant with
irregular margins and multiple small linear internal echoes.

Ultrasound of the left breast demonstrates a 6 x 5 x 3 mm cluster of
cysts in the 9 o'clock position, 5 cm from the nipple, corresponding
to the mammographic asymmetry.

The left breast implant has a normal appearance.

Mammographic images were processed with CAD.
IMPRESSION: 1. Right breast implant rupture with silicone in 3 right axillary
lymph nodes. The largest of the 3 lymph nodes corresponds to the
palpable mass.
2. 7 mm intramammary lymph node in the 11 o'clock position of the
right breast, containing silicone.
3. 6 mm benign cluster of cysts in the 9 o'clock position of the
left breast.
4. No evidence of malignancy in either breast.

RECOMMENDATION:
Bilateral screening mammogram in 1 year.

I have discussed the findings and recommendations with the patient.
If applicable, a reminder letter will be sent to the patient
regarding the next appointment.

BI-RADS CATEGORY  2: Benign.

## 2022-02-23 ENCOUNTER — Other Ambulatory Visit (HOSPITAL_BASED_OUTPATIENT_CLINIC_OR_DEPARTMENT_OTHER): Payer: Self-pay

## 2022-02-23 MED ORDER — LEVOTHYROXINE SODIUM 50 MCG PO TABS
50.0000 ug | ORAL_TABLET | Freq: Every morning | ORAL | 2 refills | Status: DC
  Filled 2022-02-23: qty 90, 90d supply, fill #0
  Filled 2022-08-17: qty 90, 90d supply, fill #1
  Filled 2022-12-07: qty 90, 90d supply, fill #2

## 2022-02-23 MED ORDER — SCOPOLAMINE 1 MG/3DAYS TD PT72
MEDICATED_PATCH | TRANSDERMAL | 0 refills | Status: AC
  Filled 2022-02-23: qty 3, 9d supply, fill #0

## 2022-02-23 MED ORDER — CLOBETASOL PROPIONATE 0.05 % EX CREA
TOPICAL_CREAM | CUTANEOUS | 2 refills | Status: AC
  Filled 2022-02-23: qty 15, 15d supply, fill #0

## 2022-08-17 ENCOUNTER — Other Ambulatory Visit: Payer: Self-pay

## 2022-08-17 ENCOUNTER — Other Ambulatory Visit (HOSPITAL_BASED_OUTPATIENT_CLINIC_OR_DEPARTMENT_OTHER): Payer: Self-pay

## 2022-12-08 ENCOUNTER — Other Ambulatory Visit: Payer: Self-pay

## 2022-12-17 ENCOUNTER — Other Ambulatory Visit (HOSPITAL_BASED_OUTPATIENT_CLINIC_OR_DEPARTMENT_OTHER): Payer: Self-pay

## 2023-01-28 ENCOUNTER — Other Ambulatory Visit (HOSPITAL_BASED_OUTPATIENT_CLINIC_OR_DEPARTMENT_OTHER): Payer: Self-pay

## 2023-01-28 DIAGNOSIS — D485 Neoplasm of uncertain behavior of skin: Secondary | ICD-10-CM | POA: Diagnosis not present

## 2023-01-28 DIAGNOSIS — R12 Heartburn: Secondary | ICD-10-CM | POA: Diagnosis not present

## 2023-01-28 DIAGNOSIS — E559 Vitamin D deficiency, unspecified: Secondary | ICD-10-CM | POA: Diagnosis not present

## 2023-01-28 DIAGNOSIS — E039 Hypothyroidism, unspecified: Secondary | ICD-10-CM | POA: Diagnosis not present

## 2023-01-28 DIAGNOSIS — L281 Prurigo nodularis: Secondary | ICD-10-CM | POA: Diagnosis not present

## 2023-01-28 DIAGNOSIS — L819 Disorder of pigmentation, unspecified: Secondary | ICD-10-CM | POA: Diagnosis not present

## 2023-01-28 DIAGNOSIS — R7309 Other abnormal glucose: Secondary | ICD-10-CM | POA: Diagnosis not present

## 2023-01-28 DIAGNOSIS — Z1211 Encounter for screening for malignant neoplasm of colon: Secondary | ICD-10-CM | POA: Diagnosis not present

## 2023-01-28 DIAGNOSIS — E785 Hyperlipidemia, unspecified: Secondary | ICD-10-CM | POA: Diagnosis not present

## 2023-01-28 DIAGNOSIS — Z0000 Encounter for general adult medical examination without abnormal findings: Secondary | ICD-10-CM | POA: Diagnosis not present

## 2023-01-28 MED ORDER — ESOMEPRAZOLE MAGNESIUM 40 MG PO CPDR
40.0000 mg | DELAYED_RELEASE_CAPSULE | Freq: Every day | ORAL | 3 refills | Status: AC
  Filled 2023-01-28: qty 90, 90d supply, fill #0

## 2023-02-09 ENCOUNTER — Other Ambulatory Visit (HOSPITAL_BASED_OUTPATIENT_CLINIC_OR_DEPARTMENT_OTHER): Payer: Self-pay

## 2023-02-15 ENCOUNTER — Other Ambulatory Visit (HOSPITAL_BASED_OUTPATIENT_CLINIC_OR_DEPARTMENT_OTHER): Payer: Self-pay

## 2023-03-04 ENCOUNTER — Other Ambulatory Visit: Payer: Self-pay

## 2023-03-04 ENCOUNTER — Other Ambulatory Visit (HOSPITAL_BASED_OUTPATIENT_CLINIC_OR_DEPARTMENT_OTHER): Payer: Self-pay

## 2023-03-04 DIAGNOSIS — Z1211 Encounter for screening for malignant neoplasm of colon: Secondary | ICD-10-CM | POA: Diagnosis not present

## 2023-03-04 MED ORDER — ESOMEPRAZOLE MAGNESIUM 20 MG PO CPDR
40.0000 mg | DELAYED_RELEASE_CAPSULE | Freq: Every day | ORAL | 3 refills | Status: AC
  Filled 2023-03-04: qty 180, 90d supply, fill #0

## 2023-03-05 ENCOUNTER — Other Ambulatory Visit (HOSPITAL_BASED_OUTPATIENT_CLINIC_OR_DEPARTMENT_OTHER): Payer: Self-pay

## 2023-03-05 MED ORDER — CLOBETASOL PROPIONATE 0.05 % EX CREA
1.0000 | TOPICAL_CREAM | Freq: Two times a day (BID) | CUTANEOUS | 2 refills | Status: AC | PRN
  Filled 2023-03-05: qty 15, 15d supply, fill #0

## 2023-03-05 MED ORDER — SCOPOLAMINE 1 MG/3DAYS TD PT72: MEDICATED_PATCH | TRANSDERMAL | 0 refills | Status: AC

## 2023-03-09 ENCOUNTER — Other Ambulatory Visit (HOSPITAL_BASED_OUTPATIENT_CLINIC_OR_DEPARTMENT_OTHER): Payer: Self-pay

## 2023-03-09 MED ORDER — SCOPOLAMINE 1 MG/3DAYS TD PT72
MEDICATED_PATCH | TRANSDERMAL | 0 refills | Status: AC
  Filled 2023-03-09: qty 3, 12d supply, fill #0

## 2023-04-05 ENCOUNTER — Other Ambulatory Visit (HOSPITAL_BASED_OUTPATIENT_CLINIC_OR_DEPARTMENT_OTHER): Payer: Self-pay

## 2023-04-06 ENCOUNTER — Other Ambulatory Visit (HOSPITAL_BASED_OUTPATIENT_CLINIC_OR_DEPARTMENT_OTHER): Payer: Self-pay

## 2023-04-06 MED ORDER — LEVOTHYROXINE SODIUM 50 MCG PO TABS
50.0000 ug | ORAL_TABLET | Freq: Every morning | ORAL | 3 refills | Status: AC
  Filled 2023-04-06: qty 90, 90d supply, fill #0
  Filled 2023-07-21: qty 90, 90d supply, fill #1
  Filled 2023-12-02 (×2): qty 90, 90d supply, fill #2

## 2023-04-08 ENCOUNTER — Other Ambulatory Visit (HOSPITAL_BASED_OUTPATIENT_CLINIC_OR_DEPARTMENT_OTHER): Payer: Self-pay

## 2023-04-13 ENCOUNTER — Other Ambulatory Visit (HOSPITAL_BASED_OUTPATIENT_CLINIC_OR_DEPARTMENT_OTHER): Payer: Self-pay

## 2023-04-28 ENCOUNTER — Other Ambulatory Visit (HOSPITAL_BASED_OUTPATIENT_CLINIC_OR_DEPARTMENT_OTHER): Payer: Self-pay

## 2023-07-15 DIAGNOSIS — H04123 Dry eye syndrome of bilateral lacrimal glands: Secondary | ICD-10-CM | POA: Diagnosis not present

## 2023-07-15 DIAGNOSIS — H47022 Hemorrhage in optic nerve sheath, left eye: Secondary | ICD-10-CM | POA: Diagnosis not present

## 2023-07-15 DIAGNOSIS — H1045 Other chronic allergic conjunctivitis: Secondary | ICD-10-CM | POA: Diagnosis not present

## 2023-07-15 DIAGNOSIS — H5319 Other subjective visual disturbances: Secondary | ICD-10-CM | POA: Diagnosis not present

## 2023-07-20 ENCOUNTER — Other Ambulatory Visit (HOSPITAL_BASED_OUTPATIENT_CLINIC_OR_DEPARTMENT_OTHER): Payer: Self-pay

## 2023-07-20 MED ORDER — SCOPOLAMINE 1 MG/3DAYS TD PT72
1.0000 | MEDICATED_PATCH | TRANSDERMAL | 0 refills | Status: AC | PRN
  Filled 2023-07-20: qty 3, 9d supply, fill #0

## 2023-09-23 ENCOUNTER — Other Ambulatory Visit: Payer: Self-pay

## 2023-09-23 ENCOUNTER — Other Ambulatory Visit (HOSPITAL_BASED_OUTPATIENT_CLINIC_OR_DEPARTMENT_OTHER): Payer: Self-pay

## 2023-09-23 MED ORDER — ROSUVASTATIN CALCIUM 10 MG PO TABS
10.0000 mg | ORAL_TABLET | Freq: Every day | ORAL | 0 refills | Status: AC
  Filled 2023-09-23 (×2): qty 90, 90d supply, fill #0

## 2023-12-02 ENCOUNTER — Other Ambulatory Visit (HOSPITAL_BASED_OUTPATIENT_CLINIC_OR_DEPARTMENT_OTHER): Payer: Self-pay

## 2023-12-02 ENCOUNTER — Other Ambulatory Visit: Payer: Self-pay

## 2023-12-02 DIAGNOSIS — H00011 Hordeolum externum right upper eyelid: Secondary | ICD-10-CM | POA: Diagnosis not present

## 2023-12-02 DIAGNOSIS — H47022 Hemorrhage in optic nerve sheath, left eye: Secondary | ICD-10-CM | POA: Diagnosis not present

## 2023-12-02 MED ORDER — MAXITROL 3.5-10000-0.1 OP OINT
1.0000 | TOPICAL_OINTMENT | Freq: Every evening | OPHTHALMIC | 1 refills | Status: AC
  Filled 2023-12-02: qty 3.5, 7d supply, fill #0
